# Patient Record
Sex: Female | Born: 1980 | Race: Black or African American | Hispanic: No | Marital: Married | Smoking: Never smoker
Health system: Southern US, Community
[De-identification: ages and names within clinical notes are randomized; demographics above are authoritative.]

## PROBLEM LIST (undated history)

## (undated) DIAGNOSIS — Z8619 Personal history of other infectious and parasitic diseases: Secondary | ICD-10-CM

## (undated) DIAGNOSIS — Z87440 Personal history of urinary (tract) infections: Secondary | ICD-10-CM

## (undated) DIAGNOSIS — T7840XA Allergy, unspecified, initial encounter: Secondary | ICD-10-CM

## (undated) DIAGNOSIS — I10 Essential (primary) hypertension: Secondary | ICD-10-CM

## (undated) DIAGNOSIS — N920 Excessive and frequent menstruation with regular cycle: Secondary | ICD-10-CM

## (undated) HISTORY — DX: Personal history of urinary (tract) infections: Z87.440

## (undated) HISTORY — DX: Allergy, unspecified, initial encounter: T78.40XA

## (undated) HISTORY — DX: Essential (primary) hypertension: I10

## (undated) HISTORY — DX: Personal history of other infectious and parasitic diseases: Z86.19

## (undated) HISTORY — DX: Excessive and frequent menstruation with regular cycle: N92.0

---

## 2001-11-05 ENCOUNTER — Emergency Department (HOSPITAL_COMMUNITY): Admission: EM | Admit: 2001-11-05 | Discharge: 2001-11-05 | Payer: Self-pay | Admitting: Emergency Medicine

## 2008-01-04 ENCOUNTER — Ambulatory Visit: Payer: Self-pay | Admitting: Family Medicine

## 2011-12-12 ENCOUNTER — Other Ambulatory Visit: Payer: Self-pay | Admitting: Allergy

## 2011-12-12 ENCOUNTER — Ambulatory Visit
Admission: RE | Admit: 2011-12-12 | Discharge: 2011-12-12 | Disposition: A | Discharge status: Home / Self Care | Payer: Federal, State, Local not specified - PPO | Source: Ambulatory Visit | Attending: Allergy | Admitting: Allergy

## 2011-12-12 DIAGNOSIS — R05 Cough: Secondary | ICD-10-CM

## 2013-09-05 DIAGNOSIS — O139 Gestational [pregnancy-induced] hypertension without significant proteinuria, unspecified trimester: Secondary | ICD-10-CM | POA: Insufficient documentation

## 2013-12-28 ENCOUNTER — Encounter (INDEPENDENT_AMBULATORY_CARE_PROVIDER_SITE_OTHER): Payer: Self-pay

## 2013-12-28 ENCOUNTER — Ambulatory Visit (INDEPENDENT_AMBULATORY_CARE_PROVIDER_SITE_OTHER): Payer: Federal, State, Local not specified - PPO | Admitting: Adult Health

## 2013-12-28 ENCOUNTER — Telehealth: Payer: Self-pay | Admitting: Adult Health

## 2013-12-28 ENCOUNTER — Encounter: Payer: Self-pay | Admitting: Adult Health

## 2013-12-28 VITALS — BP 142/88 | HR 77 | Temp 98.3°F | Resp 14 | Ht 64.5 in | Wt 205.0 lb

## 2013-12-28 DIAGNOSIS — R030 Elevated blood-pressure reading, without diagnosis of hypertension: Secondary | ICD-10-CM

## 2013-12-28 MED ORDER — EPINEPHRINE 0.3 MG/0.3ML IJ SOAJ: 0.3000 mg | Freq: Once | INTRAMUSCULAR | Status: AC

## 2013-12-28 NOTE — Telephone Encounter (Signed)
At check out pt states nexplanon should be added to her med list.

## 2013-12-28 NOTE — Patient Instructions (Signed)
  [facility].  Please monitor your blood pressure at home periodically and records. I would like to see you for follow up to see how your blood pressure is doing.  Continue to exercise and eat a healthy diet - low sodium, low saturated fats, fruits, vegetables. Drink water - plenty. Stay hydrated.  For your right wrist, apply ice 3-4 times a day for approximately 15 min. Use as wrist splint to help keep your wrist in proper alignment.  Please call with any questions or concerns.

## 2013-12-28 NOTE — Progress Notes (Signed)
Pre visit review using our clinic review tool, if applicable. No additional management support is needed unless otherwise documented below in the visit note. 

## 2013-12-28 NOTE — Telephone Encounter (Signed)
Nexplanon has been added to the medication list.

## 2013-12-28 NOTE — Progress Notes (Signed)
Patient ID: Betty Haley, female   DOB: 08/23/1981, 33 y.o.   MRN: [id number]   Subjective:    Patient ID: Betty Haley, female    DOB: 07/09/1981, 33 y.o.   MRN: [id number]  HPI  Pt is a pleasant 33 y/o female who presents to clinic to establish care. She is 3 month postpartum (girl). She reports having some elevated blood pressure readings during the end of her pregnancy (not pre-eclampsia). She is currently breast feeding and does not want to take any medication that she doesn't have to. Her blood pressure has improved since giving birth. She has started exercising and she eats a healthy diet. Has lost most of her baby weight.  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 Needs a refill on her Epi Pen.     Past Medical History  Diagnosis Date  . History of chicken pox   . Allergy   . History of UTI      History reviewed. No pertinent past surgical history.   Family History  Problem Relation Age of Onset  . Hypertension Mother   . Glaucoma Mother   . Thyroid disease Mother   . Hypertension Father   . Heart disease Father      History   Social History  . Marital Status: Married    Spouse Name: N/A    Number of Children: 1  . Years of Education: 2318   Occupational History  . [employer]     Heritage managernvironmental Protection Specialist   Social History Main Topics  . Smoking status: Never Smoker   . Smokeless tobacco: Never Used  . Alcohol Use: No  . Drug Use: No  . Sexual Activity: Not on file   Other Topics Concern  . Not on file   Social History Narrative   [name] [place]. 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 [name] attended [employer] for her Bachelor in [employer] in Jacobs EngineeringEnvironmental Science. She then obtained her Masters in [employer] EngineeringEnvironmental Science from [employer]. 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 She loves to read, travel, listen to music and movies.     Review of Systems  Constitutional:  Negative.   HENT: Negative.   Eyes: Negative.   Respiratory: Negative.   Cardiovascular: Negative.   Gastrointestinal: Negative.   Endocrine: Negative.   Genitourinary: Negative.   Musculoskeletal: Negative.   Skin: Negative.   Allergic/Immunologic: Negative.   Neurological: Negative.   Hematological: Negative.   Psychiatric/Behavioral: Negative.        Objective:  BP 142/88  Pulse 77  Temp(Src) 98.3 F (36.8 C) (Oral)  Resp 14  Ht 5' 4.5" (1.638 m)  Wt 205 lb (92.987 kg)  BMI 34.66 kg/m2  SpO2 97%   Physical Exam  Constitutional: She is oriented to person, place, and time. No distress.  HENT:  Head: Normocephalic and atraumatic.  Eyes: Conjunctivae and EOM are normal.  Neck: Normal range of motion. Neck supple.  Cardiovascular: Normal rate, regular rhythm, normal heart sounds and intact distal pulses.  Exam reveals no gallop and no friction rub.   No murmur heard. Pulmonary/Chest: Effort normal and breath sounds normal. No respiratory distress. She has no wheezes. She has no rales.  Musculoskeletal: Normal range of motion.  Neurological: She is alert and oriented to person, place, and time. She has normal reflexes. Coordination normal.  Skin: Skin is warm and dry.  Psychiatric: She has a normal mood and  affect. Her behavior is normal. Judgment and thought content normal.      Assessment & Plan:   1. Elevated blood pressure readings without HTN Blood pressure has lowered since having her baby 3 months ago. She is exercising regularly. Eating a healthy diet. Needs to increase water intake. She will check her blood pressure periodically and records. RTC in 2 months for follow up.

## 2014-04-17 DIAGNOSIS — J019 Acute sinusitis, unspecified: Secondary | ICD-10-CM | POA: Insufficient documentation

## 2015-03-22 ENCOUNTER — Encounter: Payer: Self-pay | Admitting: Obstetrics and Gynecology

## 2015-03-22 ENCOUNTER — Ambulatory Visit (INDEPENDENT_AMBULATORY_CARE_PROVIDER_SITE_OTHER): Payer: Federal, State, Local not specified - PPO | Admitting: Obstetrics and Gynecology

## 2015-03-22 VITALS — BP 150/82 | HR 80 | Ht 65.0 in | Wt 217.4 lb

## 2015-03-22 DIAGNOSIS — Z308 Encounter for other contraceptive management: Secondary | ICD-10-CM

## 2015-03-22 DIAGNOSIS — Z3046 Encounter for surveillance of implantable subdermal contraceptive: Secondary | ICD-10-CM

## 2015-03-22 MED ORDER — ETONOGESTREL-ETHINYL ESTRADIOL 0.12-0.015 MG/24HR VA RING: VAGINAL_RING | VAGINAL | Status: DC

## 2015-03-23 NOTE — Progress Notes (Signed)
     GYNECOLOGY CLINIC PROCEDURE NOTE  Betty Haley is a 34 y.o. G1P1001 here for Nexplanon removal.  Patient complains of continued weight gain despite diet and exercise.   Contributes weight gain to Nexplanon.  Has had implant for 1.5 years.   Nexplanon Removal Patient identified, informed consent performed, consent signed.   Appropriate time out taken. Nexplanon site identified.  Area prepped in usual sterile fashon. One ml of 1% lidocaine was used to anesthetize the area at the distal end of the implant. A small stab incision was made right beside the implant on the distal portion.  The Nexplanon rod was grasped using hemostats and removed without difficulty.  There was minimal blood loss. There were no complications.  2 ml of 1% lidocaine was injected around the incision for post-procedure analgesia.  Steri-strips were applied over the small incision.  A pressure bandage was applied to reduce any bruising.  The patient tolerated the procedure well and was given post procedure instructions.  Patient is planning to use NuvaRing for contraception/attempt conception.  Prescription given.  Advised to use backup method for contraception x 1 week.    Hildred Laser, MD [facility]

## 2016-03-17 ENCOUNTER — Other Ambulatory Visit: Payer: Self-pay

## 2016-03-17 ENCOUNTER — Other Ambulatory Visit: Payer: Self-pay | Admitting: Obstetrics and Gynecology

## 2016-03-17 DIAGNOSIS — Z30018 Encounter for initial prescription of other contraceptives: Secondary | ICD-10-CM

## 2016-03-17 MED ORDER — ETONOGESTREL-ETHINYL ESTRADIOL 0.12-0.015 MG/24HR VA RING: VAGINAL_RING | VAGINAL | Status: AC

## 2016-05-09 ENCOUNTER — Encounter: Payer: Federal, State, Local not specified - PPO | Attending: Family Medicine | Admitting: Dietician

## 2016-05-09 ENCOUNTER — Encounter: Payer: Self-pay | Admitting: Dietician

## 2016-05-09 VITALS — Ht 65.0 in | Wt 212.5 lb

## 2016-05-09 DIAGNOSIS — E669 Obesity, unspecified: Secondary | ICD-10-CM | POA: Diagnosis not present

## 2016-05-09 NOTE — Patient Instructions (Signed)
   Follow plan for best food portions and balance in meals.  Include a variety of different foods and meal options to allow longer-term consistency of intake.   Consider tracking food intake using app such as LoseIt or MyFitnessPal.   Keep up healthy choices and regular exercise, great job!

## 2016-05-09 NOTE — Progress Notes (Signed)
Medical Nutrition Therapy: Visit start time: 0930  end time: 1030  Assessment:  Diagnosis: obesity Past medical history: none significant per patient Psychosocial issues/ stress concerns: none; states she does some overeating when stressed Preferred learning method:  . Auditory . Visual  Current weight: 212.5lbs  Height: 5\' 5"   Medications, supplements: reviewed list in chart with patient  Progress and evaluation: Patient reports making some changes to lose weight, including general portion control and increasing vegetables, and she has recently lost about 8lbs. Now at a plateau for the past 2 months. Was following Dr. Manuella GhaziIan Haley diet (outlined specific foods and meals for each day)   Physical activity: cardio and weight training 40 minutes, 4 times a week.   Dietary Intake:  Usual eating pattern includes 3 meals and 2-3 snacks per day. Dining out frequency: 2 meals per week.  Breakfast: after working out; 9-930 usually fruit, sometimes protein shake, or bacon. Rarely oatmeal.  Snack: fruit or crackers Lunch: large salad, rarely sandwich Snack: pretzels, some beans, or light soup Supper: meat, veg, starch -- choices vary Snack: occasionally pretzels Beverages: water, 1c coffee daily, 1c juice daily  Nutrition Care Education: Topics covered: weight management Basic nutrition: basic food groups, appropriate nutrient balance, appropriate meal and snack schedule, general nutrition guidelines    Weight control: 1500kcal daily intake goal; portion control; healthy meal options and snack choices; benefits of tracking food intake and methods for doing so.   Nutritional Diagnosis:  [place]-3.3 Overweight/obesity As related to history of excess calories.  As evidenced by patient report.  Intervention: Instruction as noted above.   Patient feels she needs to work further on controlling food portions, and changing her typical food choices (although currently healthy) in attempt to restart weight  loss.    Set goals with patient direction.    Commended patient for changes already made.  Education Materials given:  . Food lists/ Planning A Balanced Meal . Sample meal pattern/ menus: Quick and Healthy Meal Ideas . Snacking handout . Goals/ instructions  Learner/ who was taught:  . Patient   Level of understanding: Marland Kitchen. Verbalizes/ demonstrates competency  Demonstrated degree of understanding via:   Teach back Learning barriers: . None  Willingness to learn/ readiness for change: . Eager, change in progress  Monitoring and Evaluation:  Dietary intake, exercise, and body weight      follow up: 06/13/16

## 2016-06-13 ENCOUNTER — Encounter: Payer: Self-pay | Admitting: Dietician

## 2016-06-13 ENCOUNTER — Encounter: Payer: Federal, State, Local not specified - PPO | Attending: Family Medicine | Admitting: Dietician

## 2016-06-13 VITALS — Ht 65.0 in | Wt 214.8 lb

## 2016-06-13 DIAGNOSIS — Z6835 Body mass index (BMI) 35.0-35.9, adult: Secondary | ICD-10-CM

## 2016-06-13 DIAGNOSIS — E6609 Other obesity due to excess calories: Secondary | ICD-10-CM

## 2016-06-13 NOTE — Patient Instructions (Signed)
   Control portions of evening snacks by pre-measuring portions into snack size bags or containers.   Eat snacks slowly and avoid distractions while you are eating, no multi-tasking when you eat.   Try drinking some water or other sugar free drink before or during your snack time to feel more full.   Resume tracking your food intake, exercise with MyFitnessPal.   Keep up the great exercise and healthy food choices, you are doing a great job!

## 2016-06-13 NOTE — Progress Notes (Signed)
Medical Nutrition Therapy: Visit start time: 0910  end time: 0940  Assessment:  Diagnosis: obesity Medical history changes: no changes Psychosocial issues/ stress concerns: none  Current weight: 214.8lbs  Height: 5'5" Medications, supplement changes: no changes  Progress and evaluation: Fluid retention during menstrual cycle is likely cause of weight gain of 2.3lbs since 05/09/16 visit. Patient reports good progress with diet and exercise; she has been working with a Systems analystpersonal trainer and has changed workout routine, and has noticed clothing fitting more loosely.  She feels she is doing well with diet and calorie control during the day, bringing lunches to work more regularly and controlling food portions. She feels she still needs to work on portions of evening snacks.   Physical activity: cardio and weights at gym 40 minutes, 4 times a week + extra walking during the day  Dietary Intake:  Usual eating pattern includes 3 meals and 2-3 snacks per day. Dining out frequency: 1-2 meals per week.  Breakfast: protein shake, fruit, occasionally oatmeal Snack: fruit Lunch: salad or occasional sandwich Snack: vegetables or light soup Supper: meat, vegetables, starch Snack: pretzels, crackers -- difficult to control portions Beverages: water, coffee, rarely juice  Nutrition Care Education: Topics covered: weight management    Weight control: Reviewed patient's progress since previous visit. Discussed snack choices and appropriate portions; options for better portion control, appetite control, and avoiding feelings of deprivation-- encouraged having a plan for healthy snack choices, pre-portioning snacks, eating slowly.   Nutritional Diagnosis:  [place]-3.3 Overweight/obesity As related to history of excess calories, inactivity.  As evidenced by patient report.  Intervention: Discussion as noted above.   Commended patient for ongiong efforts with diet and exercise.   Updated goals with patient  input.  Education Materials given:  . 100 calorie snacks . Goals/ instructions  Learner/ who was taught:  . Patient   Level of understanding: Marland Kitchen. Verbalizes/ demonstrates competency  Demonstrated degree of understanding via:   Teach back Learning barriers: . None  Willingness to learn/ readiness for change: . Eager, change in progress  Monitoring and Evaluation:  Dietary intake, exercise, and body weight      follow up: 08/01/16

## 2016-06-17 ENCOUNTER — Other Ambulatory Visit: Payer: Self-pay | Admitting: Obstetrics and Gynecology

## 2016-06-17 DIAGNOSIS — Z30018 Encounter for initial prescription of other contraceptives: Secondary | ICD-10-CM

## 2016-08-01 ENCOUNTER — Ambulatory Visit: Payer: Federal, State, Local not specified - PPO | Admitting: Dietician

## 2016-08-28 ENCOUNTER — Encounter: Payer: Self-pay | Admitting: Dietician

## 2016-08-28 NOTE — Progress Notes (Signed)
Patient has not responded to reschedule appointment which she cancelled on 08/01/16. Sent discharge letter to MD.

## 2019-04-13 ENCOUNTER — Other Ambulatory Visit: Payer: Self-pay

## 2019-04-13 DIAGNOSIS — Z20822 Contact with and (suspected) exposure to covid-19: Secondary | ICD-10-CM

## 2019-04-15 LAB — NOVEL CORONAVIRUS, NAA: SARS-CoV-2, NAA: NOT DETECTED

## 2020-05-16 ENCOUNTER — Other Ambulatory Visit: Payer: Self-pay | Admitting: Family Medicine

## 2020-05-16 DIAGNOSIS — Z1231 Encounter for screening mammogram for malignant neoplasm of breast: Secondary | ICD-10-CM

## 2020-05-18 ENCOUNTER — Inpatient Hospital Stay
Admission: RE | Admit: 2020-05-18 | Discharge: 2020-05-18 | Disposition: A | Discharge status: Home / Self Care | Payer: Self-pay | Source: Ambulatory Visit | Attending: *Deleted | Admitting: *Deleted

## 2020-05-18 ENCOUNTER — Other Ambulatory Visit: Payer: Self-pay | Admitting: *Deleted

## 2020-05-18 DIAGNOSIS — Z1231 Encounter for screening mammogram for malignant neoplasm of breast: Secondary | ICD-10-CM

## 2020-06-01 ENCOUNTER — Other Ambulatory Visit: Payer: Self-pay

## 2020-06-01 ENCOUNTER — Ambulatory Visit
Admission: RE | Admit: 2020-06-01 | Discharge: 2020-06-01 | Disposition: A | Discharge status: Home / Self Care | Payer: Federal, State, Local not specified - PPO | Source: Ambulatory Visit | Attending: Family Medicine | Admitting: Family Medicine

## 2020-06-01 DIAGNOSIS — Z1231 Encounter for screening mammogram for malignant neoplasm of breast: Secondary | ICD-10-CM | POA: Diagnosis not present

## 2020-10-04 ENCOUNTER — Ambulatory Visit: Payer: Federal, State, Local not specified - PPO | Admitting: Dermatology

## 2021-01-23 ENCOUNTER — Other Ambulatory Visit: Payer: Self-pay | Admitting: Family Medicine

## 2021-01-23 DIAGNOSIS — Z1231 Encounter for screening mammogram for malignant neoplasm of breast: Secondary | ICD-10-CM

## 2021-06-03 ENCOUNTER — Ambulatory Visit
Admission: RE | Admit: 2021-06-03 | Discharge: 2021-06-03 | Disposition: A | Discharge status: Home / Self Care | Payer: Federal, State, Local not specified - PPO | Source: Ambulatory Visit | Attending: Family Medicine | Admitting: Family Medicine

## 2021-06-03 ENCOUNTER — Other Ambulatory Visit: Payer: Self-pay

## 2021-06-03 DIAGNOSIS — Z1231 Encounter for screening mammogram for malignant neoplasm of breast: Secondary | ICD-10-CM | POA: Diagnosis not present

## 2022-02-14 ENCOUNTER — Other Ambulatory Visit: Payer: Self-pay | Admitting: Family Medicine

## 2022-02-14 DIAGNOSIS — Z1231 Encounter for screening mammogram for malignant neoplasm of breast: Secondary | ICD-10-CM

## 2022-06-06 ENCOUNTER — Ambulatory Visit
Admission: RE | Admit: 2022-06-06 | Discharge: 2022-06-06 | Disposition: A | Discharge status: Home / Self Care | Payer: Federal, State, Local not specified - PPO | Source: Ambulatory Visit | Attending: Family Medicine | Admitting: Family Medicine

## 2022-06-06 DIAGNOSIS — Z1231 Encounter for screening mammogram for malignant neoplasm of breast: Secondary | ICD-10-CM | POA: Diagnosis present

## 2023-01-09 IMAGING — MG MM DIGITAL SCREENING BILAT W/ TOMO AND CAD
8 series · 8 of 24 positions shown · non-contrast
Comparison: Previous exam(s).

CLINICAL DATA: Screening.

EXAM:
DIGITAL SCREENING BILATERAL MAMMOGRAM WITH TOMOSYNTHESIS AND CAD
TECHNIQUE: Bilateral screening digital craniocaudal and mediolateral oblique
mammograms were obtained. Bilateral screening digital breast
tomosynthesis was performed. The images were evaluated with
computer-aided detection.

[R CC synth-2D]
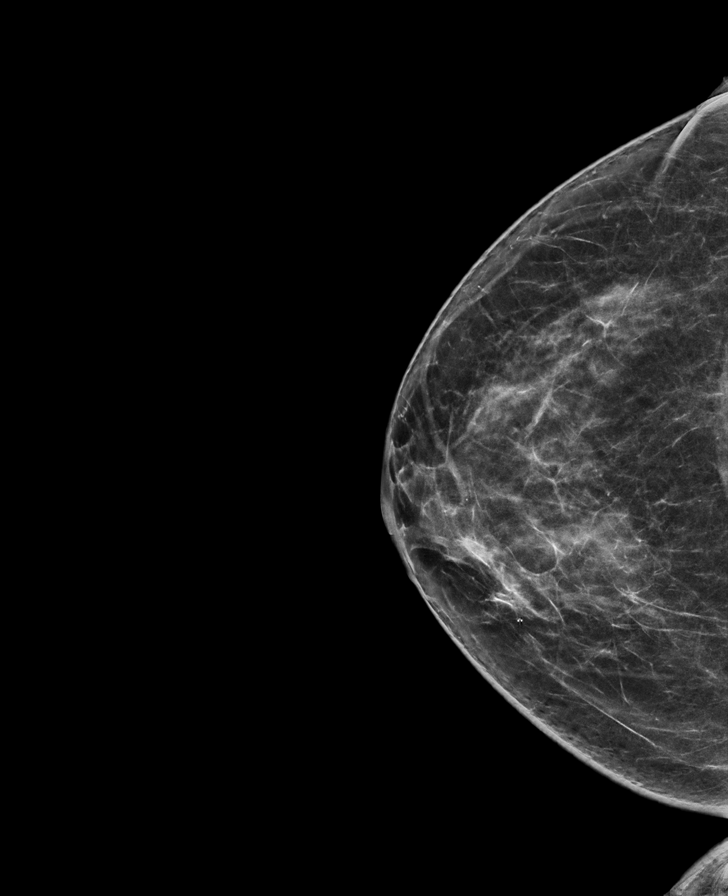

[R MLO synth-2D]
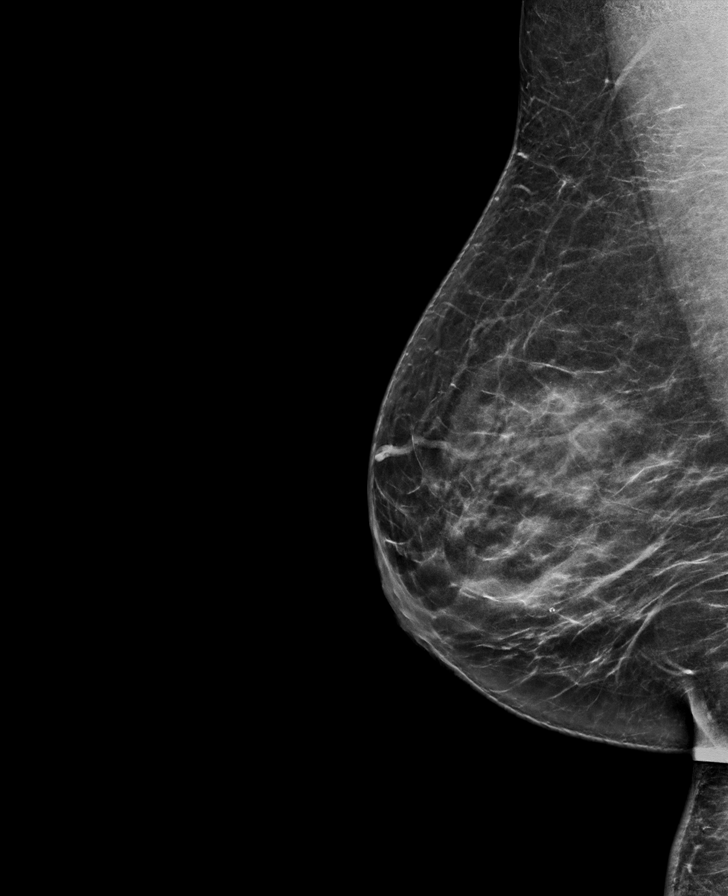

[L MLO synth-2D]
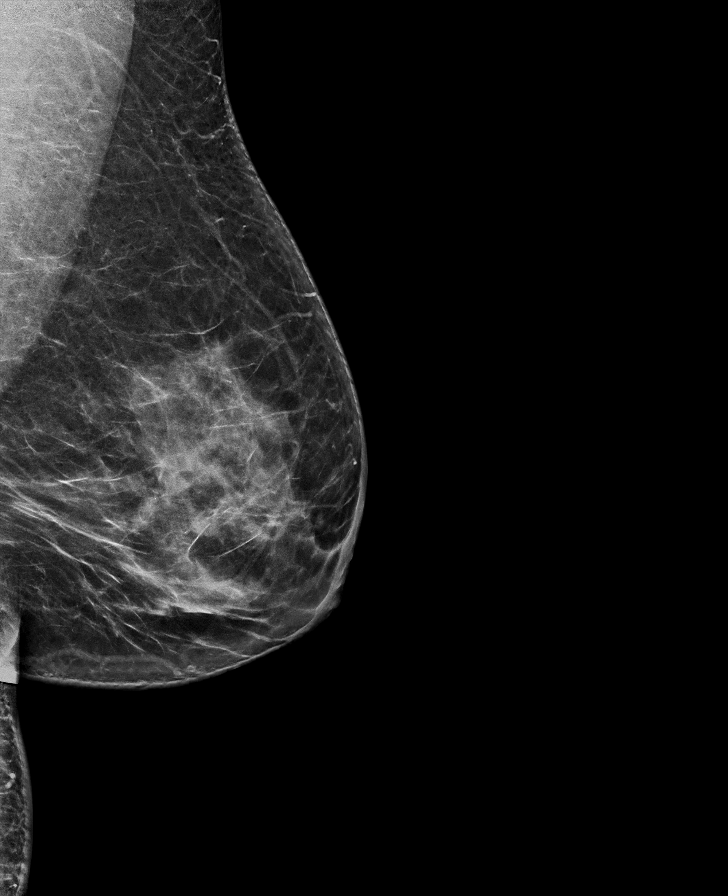

[L CC synth-2D]
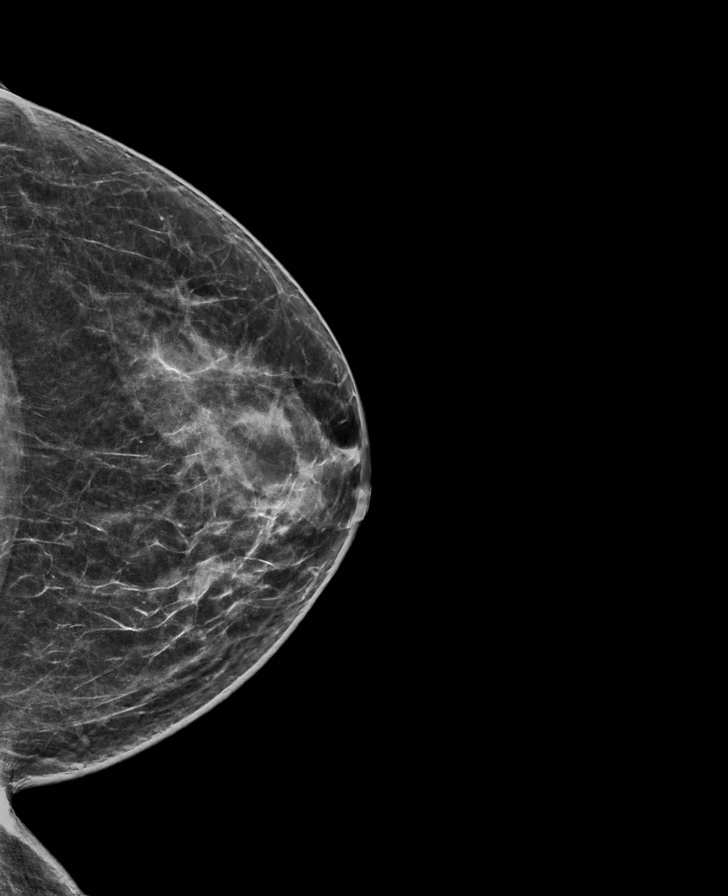

[R CC tomo · tomo slice 35/69.0]
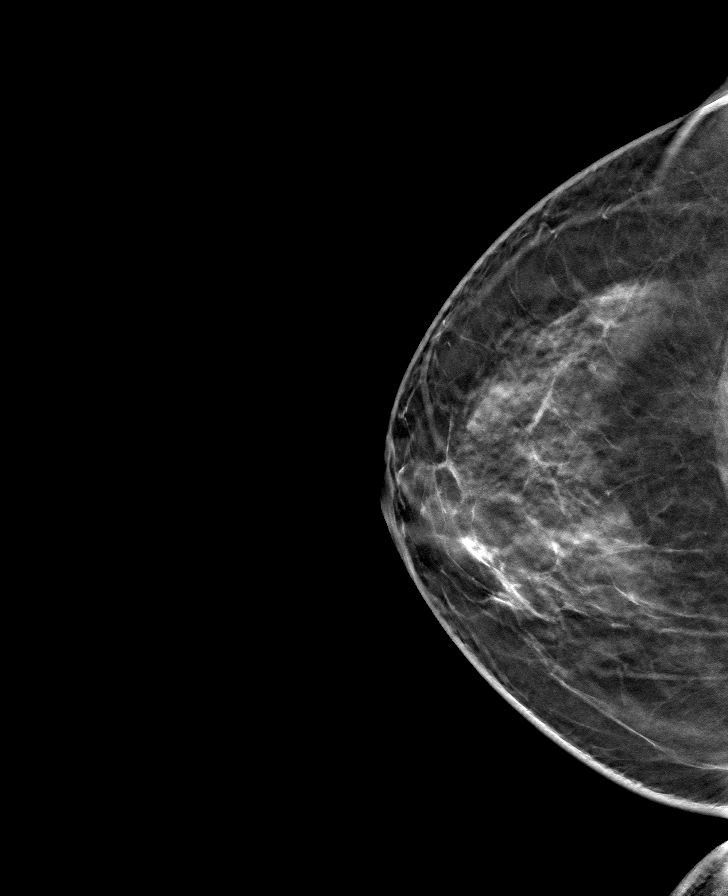

[L CC tomo · tomo slice 34/67.0]
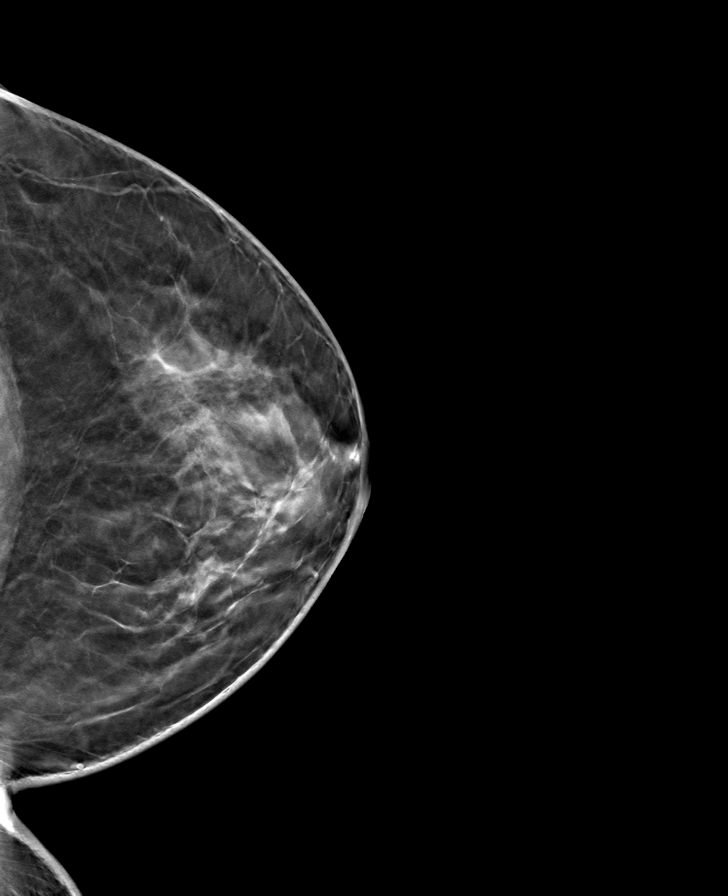

[R MLO tomo · tomo slice 37/73.0]
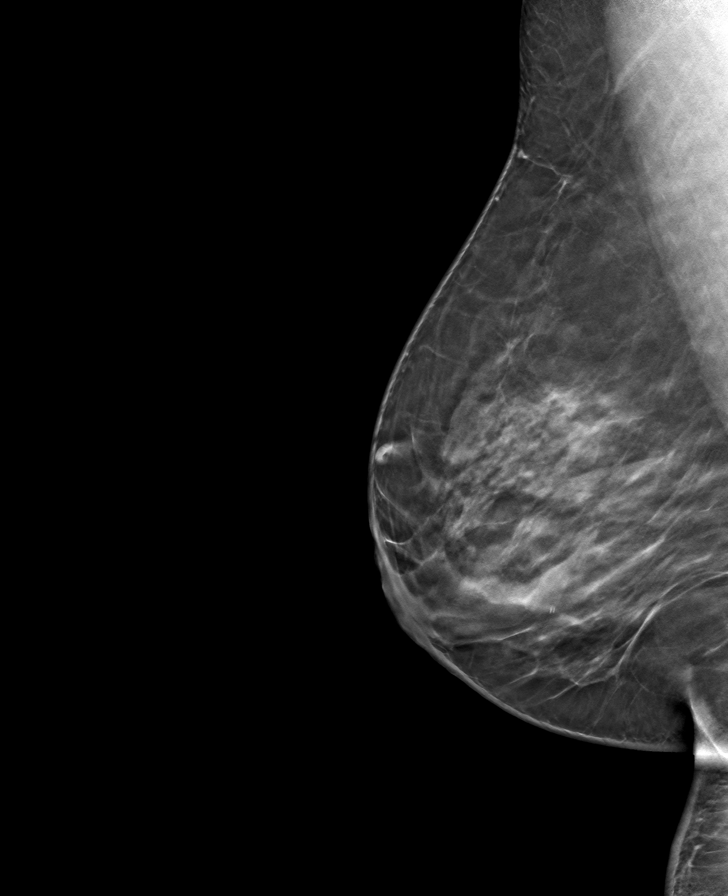

[L MLO tomo · tomo slice 37/72.0]
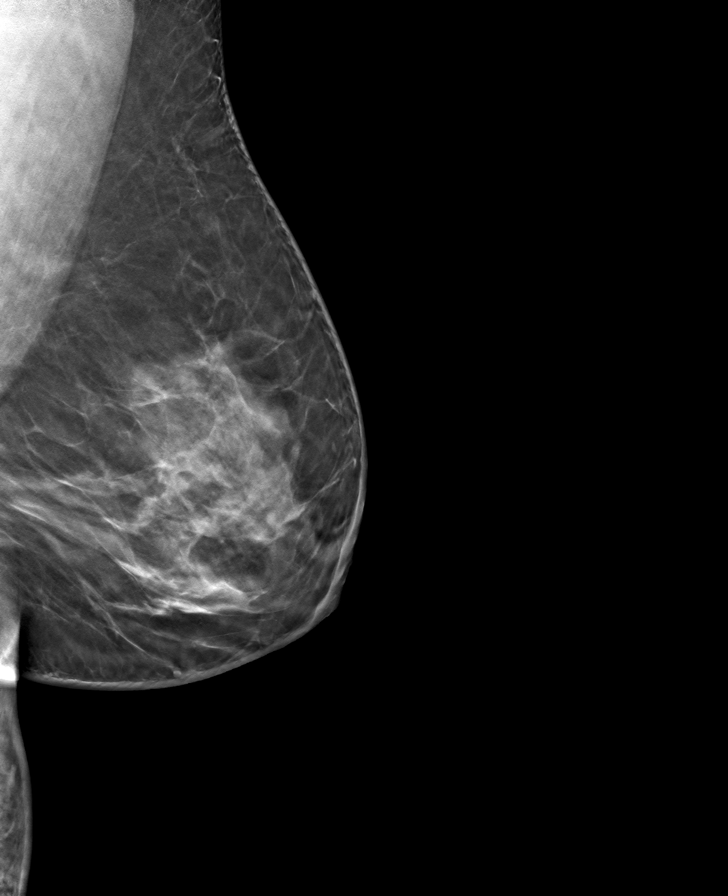

[8 of 24 positions shown; findings below may reference images not displayed]

ACR Breast Density Category c: The breast tissue is heterogeneously
dense, which may obscure small masses.
FINDINGS: There are no findings suspicious for malignancy.
IMPRESSION: No mammographic evidence of malignancy. A result letter of this
screening mammogram will be mailed directly to the patient.

RECOMMENDATION:
Screening mammogram in one year. (Code:Q3-W-BC3)

BI-RADS CATEGORY  1: Negative.

## 2023-03-20 ENCOUNTER — Ambulatory Visit (INDEPENDENT_AMBULATORY_CARE_PROVIDER_SITE_OTHER): Payer: Federal, State, Local not specified - PPO | Admitting: Podiatry

## 2023-03-20 ENCOUNTER — Encounter: Payer: Self-pay | Admitting: Podiatry

## 2023-03-20 ENCOUNTER — Ambulatory Visit (INDEPENDENT_AMBULATORY_CARE_PROVIDER_SITE_OTHER): Payer: Federal, State, Local not specified - PPO

## 2023-03-20 ENCOUNTER — Other Ambulatory Visit: Payer: Self-pay | Admitting: Family Medicine

## 2023-03-20 DIAGNOSIS — L84 Corns and callosities: Secondary | ICD-10-CM

## 2023-03-20 DIAGNOSIS — Z1231 Encounter for screening mammogram for malignant neoplasm of breast: Secondary | ICD-10-CM

## 2023-03-20 NOTE — Progress Notes (Signed)
   Chief Complaint  Patient presents with   Foot Pain    "I have a callus on the ball of my feet and I want him to take a look at the soft corns.  I want him to tell me what can be done for all of that." N - callus / corns L - 2nd met. Bilateral callus, 5th digit bilateral corn D - calluses - 1.5 yrs., corns - 10 years O - gradually gotten worse C - tender, sore A - pressure, walking T - none    Subjective: 42 y.o. female presenting to the office today as a new patient for evaluation of symptomatic calluses to the plantar aspect of the bilateral forefoot as well as the fifth digits bilateral.  Patient states that the corns on the fifth digits bilateral are mostly asymptomatic.  She has had some increased pain and tenderness associated with the plantar forefoot calluses.  She does admit to walking around the house barefoot.   Past Medical History:  Diagnosis Date   Allergy    History of chicken pox    History of UTI    Hypertension    Menorrhagia     No past surgical history on file.  Allergies  Allergen Reactions   Cashew Nut Oil Anaphylaxis   Dextromethorphan-Guaifenesin Nausea And Vomiting   Macadamia Nut Oil Itching     Objective:  Physical Exam General: Alert and oriented x3 in no acute distress  Dermatology: Hyperkeratotic lesion(s) present on the plantar aspect of the second MTP bilateral as well as the fifth digit PIPJ bilateral. Pain on palpation with a central nucleated core noted. Skin is warm, dry and supple bilateral lower extremities. Negative for open lesions or macerations.  Vascular: Palpable pedal pulses bilaterally. No edema or erythema noted. Capillary refill within normal limits.  Neurological: Grossly intact via light touch  Musculoskeletal Exam: There are some slight associated tenderness on palpation at the keratotic lesion(s) noted. Range of motion within normal limits bilateral. Muscle strength 5/5 in all groups bilateral.  Assessment: 1.   Symptomatic benign skin lesions plantar second MTP bilateral 2.  Hammertoe deformity with overlying callus/corn fifth digit bilateral   Plan of Care:  -Patient evaluated -Excisional debridement of keratoic lesion(s) using a chisel blade was performed without incident.  - Advised against going barefoot.  Recommend good supportive shoes and sneakers that allow arch support and offload pressure from the forefoot -Return to the clinic PRN.   Felecia Shelling, DPM [facility]  Dr. Felecia Shelling, DPM    [address] [place], [place] [address]                Office [phone]  Fax [phone]

## 2023-06-08 ENCOUNTER — Ambulatory Visit
Admission: RE | Admit: 2023-06-08 | Discharge: 2023-06-08 | Disposition: A | Discharge status: Home / Self Care | Payer: Federal, State, Local not specified - PPO | Source: Ambulatory Visit | Attending: Family Medicine | Admitting: Family Medicine

## 2023-06-08 DIAGNOSIS — Z1231 Encounter for screening mammogram for malignant neoplasm of breast: Secondary | ICD-10-CM | POA: Diagnosis present

## 2024-04-08 ENCOUNTER — Ambulatory Visit: Payer: Self-pay

## 2024-04-08 DIAGNOSIS — D120 Benign neoplasm of cecum: Secondary | ICD-10-CM | POA: Diagnosis not present

## 2024-04-08 DIAGNOSIS — Z1211 Encounter for screening for malignant neoplasm of colon: Secondary | ICD-10-CM | POA: Diagnosis present

## 2024-04-20 ENCOUNTER — Other Ambulatory Visit: Payer: Self-pay | Admitting: Family Medicine

## 2024-04-20 DIAGNOSIS — Z1231 Encounter for screening mammogram for malignant neoplasm of breast: Secondary | ICD-10-CM

## 2024-06-10 ENCOUNTER — Ambulatory Visit
Admission: RE | Admit: 2024-06-10 | Discharge: 2024-06-10 | Disposition: A | Discharge status: Home / Self Care | Source: Ambulatory Visit | Attending: Family Medicine | Admitting: Family Medicine

## 2024-06-10 DIAGNOSIS — Z1231 Encounter for screening mammogram for malignant neoplasm of breast: Secondary | ICD-10-CM | POA: Diagnosis present
